# Patient Record
Sex: Male | Born: 1950 | Race: White | Hispanic: No | Marital: Married | Smoking: Never smoker
Health system: Southern US, Community
[De-identification: ages and names within clinical notes are randomized; demographics above are authoritative.]

## PROBLEM LIST (undated history)

## (undated) DIAGNOSIS — I10 Essential (primary) hypertension: Secondary | ICD-10-CM

---

## 2008-12-03 ENCOUNTER — Encounter: Payer: Self-pay | Admitting: Cardiology

## 2009-01-30 ENCOUNTER — Emergency Department (HOSPITAL_COMMUNITY): Admission: EM | Admit: 2009-01-30 | Discharge: 2009-01-30 | Payer: Self-pay | Admitting: Emergency Medicine

## 2009-02-11 ENCOUNTER — Emergency Department (HOSPITAL_COMMUNITY): Admission: EM | Admit: 2009-02-11 | Discharge: 2009-02-11 | Payer: Self-pay | Admitting: Emergency Medicine

## 2009-11-19 ENCOUNTER — Encounter: Payer: Self-pay | Admitting: Cardiology

## 2009-12-02 ENCOUNTER — Emergency Department (HOSPITAL_COMMUNITY): Admission: EM | Admit: 2009-12-02 | Discharge: 2009-12-02 | Payer: Self-pay | Admitting: Emergency Medicine

## 2010-04-18 IMAGING — CR DG CHEST 2V
2 series · 2 of 2 positions shown · non-contrast
Comparison: None

CLINICAL DATA: Cough, fever, congestion

CHEST - 2 VIEW

[view not recorded (1 of 2)]
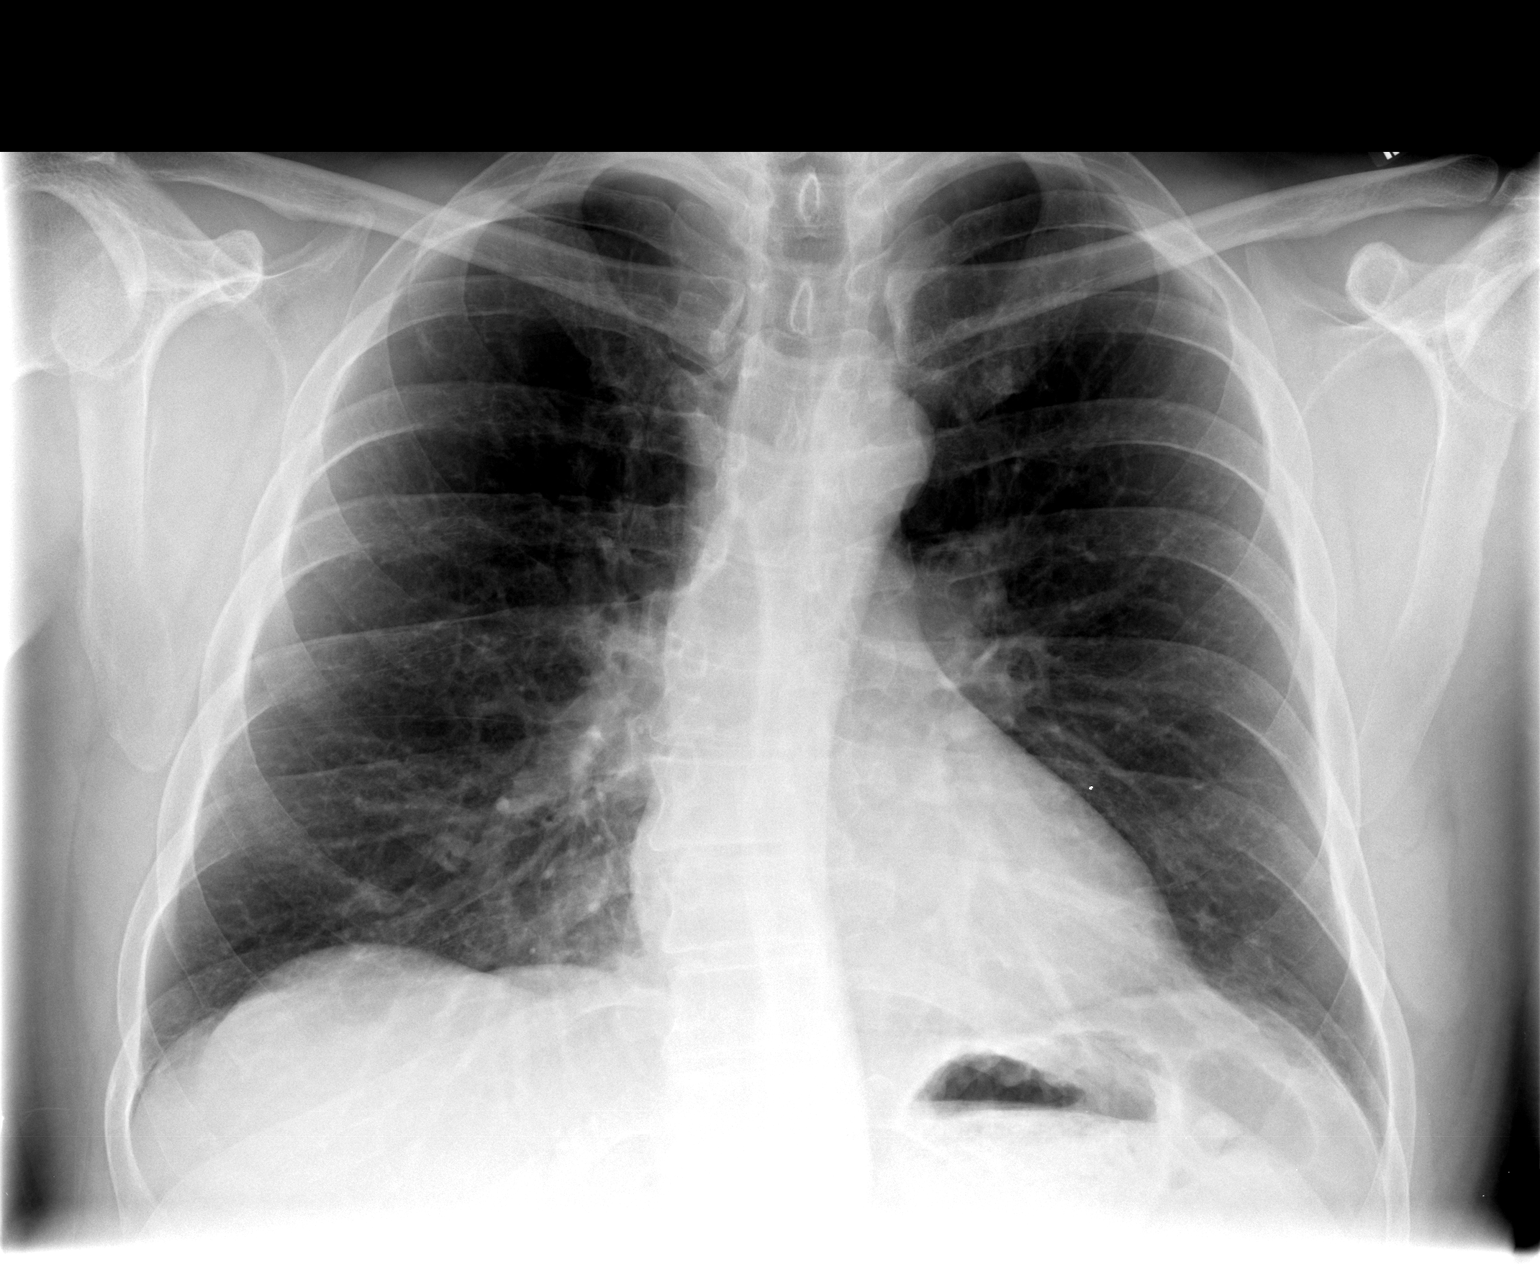

[view not recorded (2 of 2)]
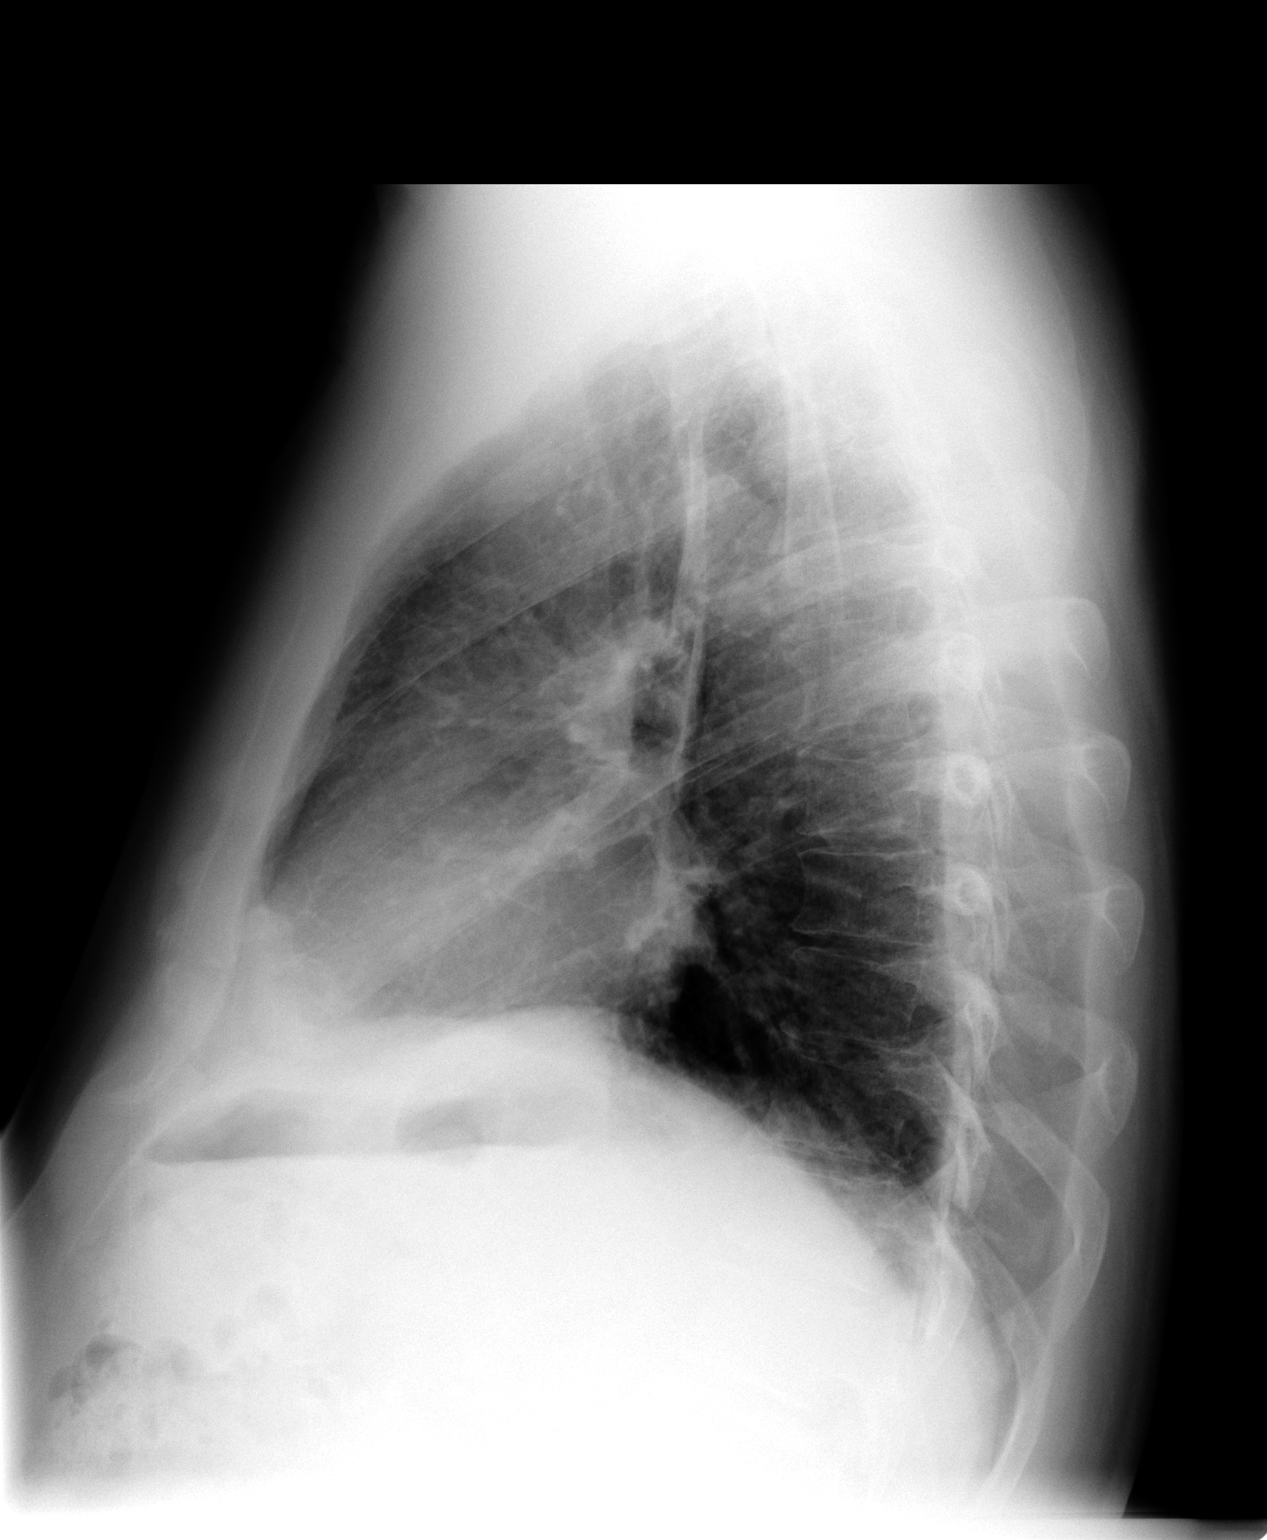

[2 of 2 positions shown; findings below may reference images not displayed]

FINDINGS: The lungs are clear.  There is some peribronchial
thickening which may indicate bronchitis.  The heart is within
normal limits in size.  There is a mild thoracic scoliosis present.
IMPRESSION: No active lung disease. Peribronchial thickening may indicate
bronchitis.

## 2010-09-22 NOTE — Letter (Signed)
Summary: OFFICE NOTE FROM [facility] OFFICE  OFFICE NOTE FROM [facility] OFFICE   Imported By: Claudette Laws 11/20/2009 09:32:01  _____________________________________________________________________  External Attachment:    Type:   Image     Comment:   External Document

## 2011-02-06 IMAGING — CR DG CHEST 2V
2 series · 2 of 2 positions shown · non-contrast
Comparison: 02/11/2009

CLINICAL DATA: Cough

CHEST - 2 VIEW

[view not recorded (1 of 2)]
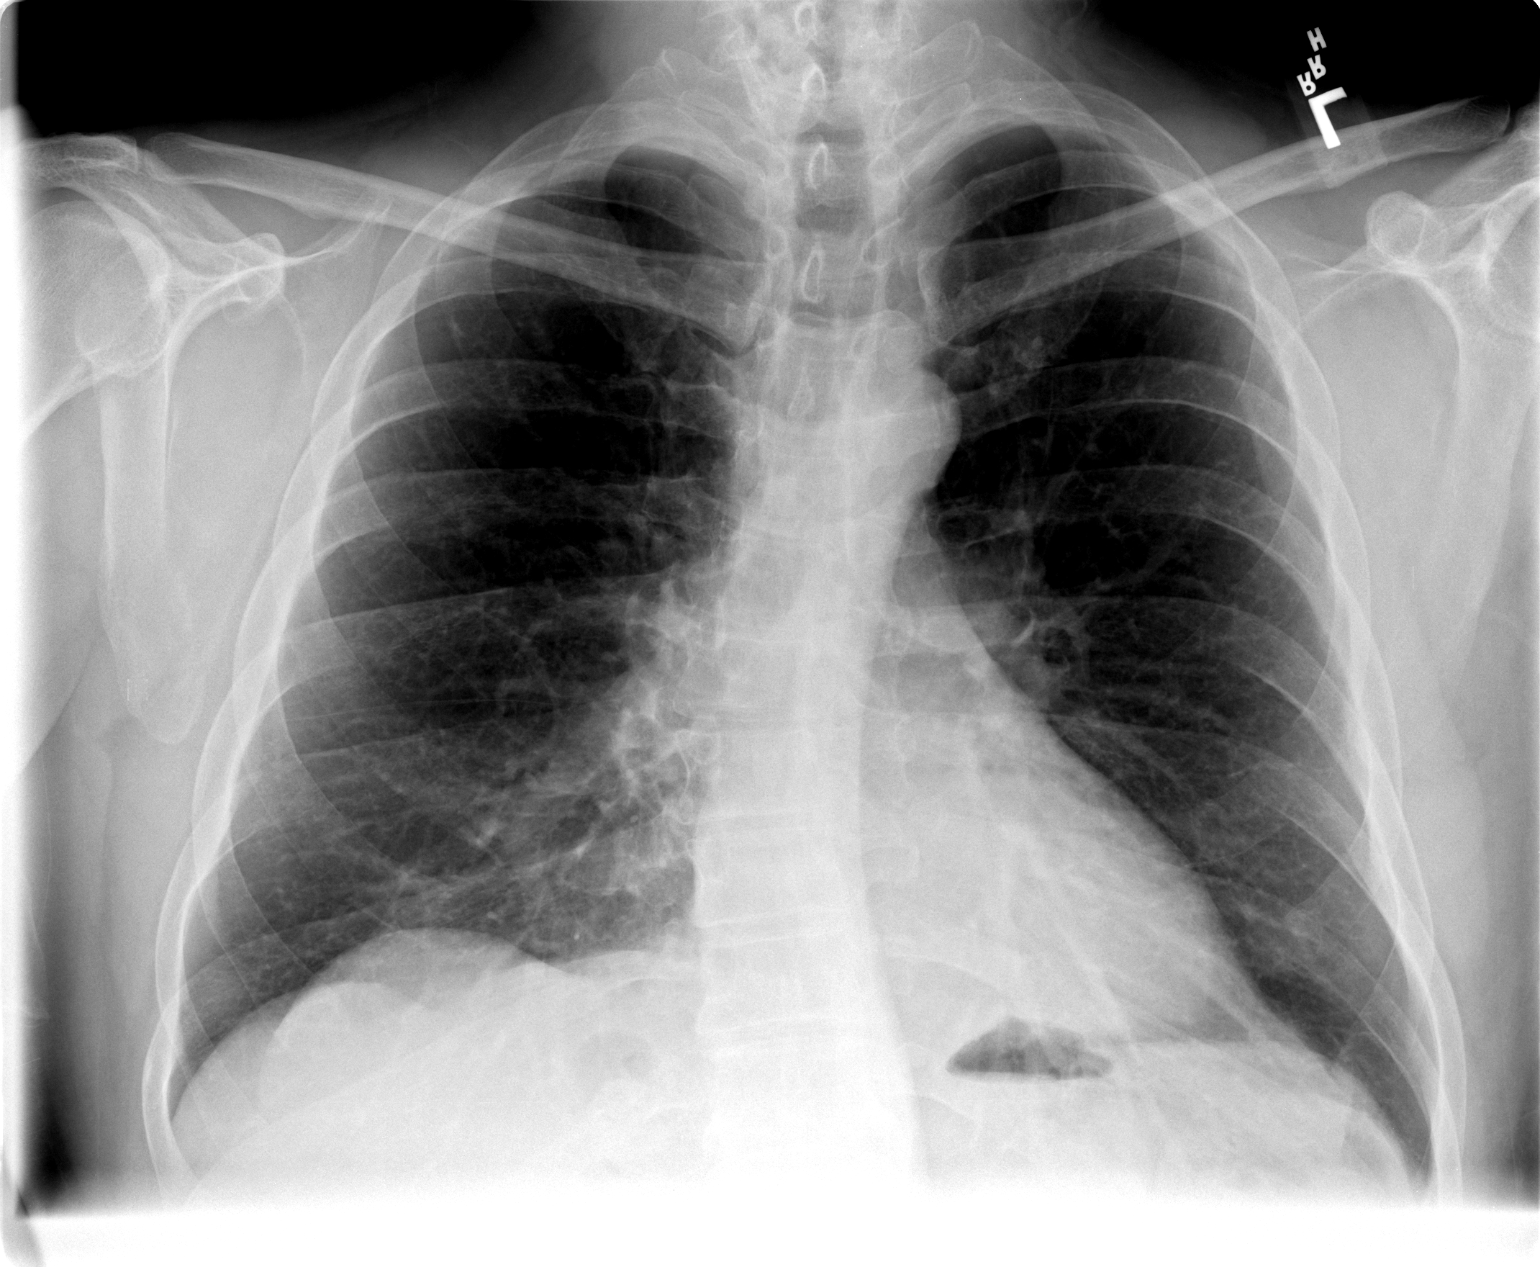

[view not recorded (2 of 2)]
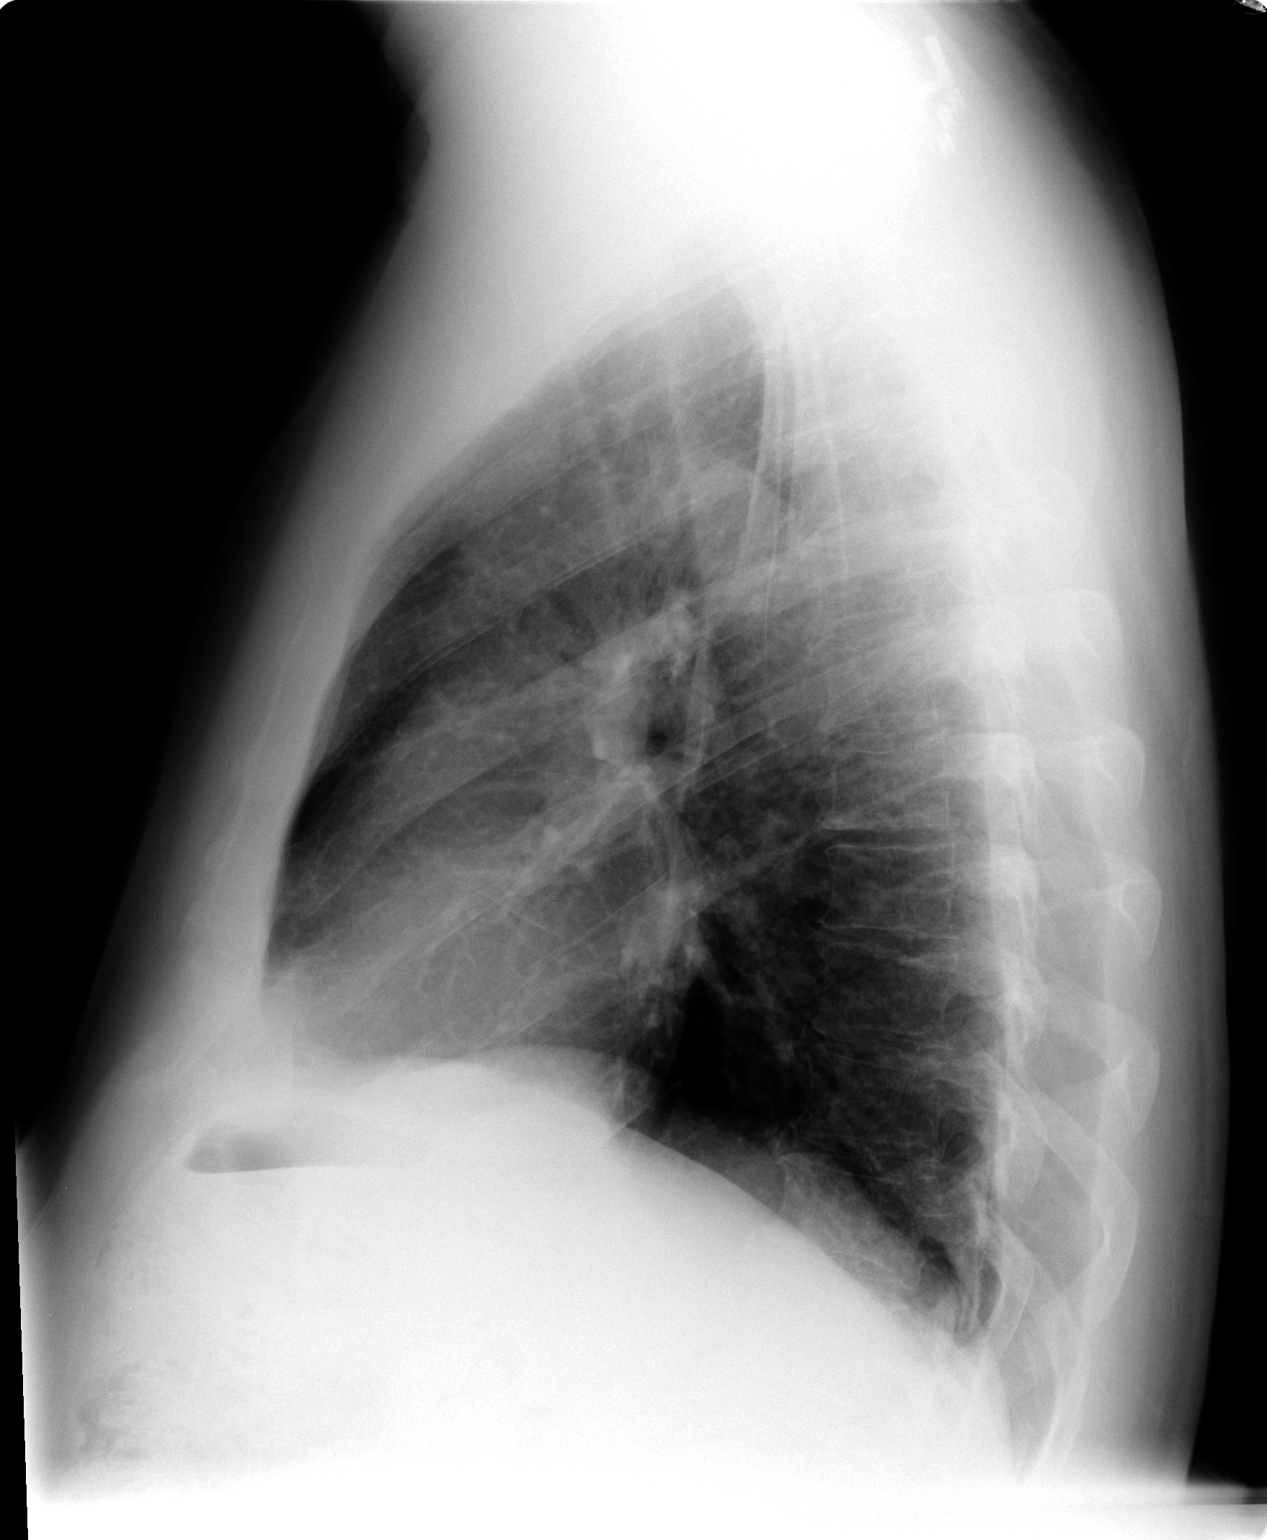

[2 of 2 positions shown; findings below may reference images not displayed]

FINDINGS: Mild dextroscoliosis of the thoracic spine is stable.
Lungs clear.  Heart size and pulmonary vascularity normal.  No
effusion.
IMPRESSION: No acute disease

## 2011-06-05 ENCOUNTER — Encounter: Payer: Self-pay | Admitting: *Deleted

## 2011-06-05 ENCOUNTER — Emergency Department (HOSPITAL_COMMUNITY)
Admission: EM | Admit: 2011-06-05 | Discharge: 2011-06-05 | Disposition: A | Discharge status: Home / Self Care | Payer: Medicaid - Out of State | Attending: Emergency Medicine | Admitting: Emergency Medicine

## 2011-06-05 DIAGNOSIS — L723 Sebaceous cyst: Secondary | ICD-10-CM | POA: Insufficient documentation

## 2011-06-05 DIAGNOSIS — L729 Follicular cyst of the skin and subcutaneous tissue, unspecified: Secondary | ICD-10-CM

## 2011-06-05 DIAGNOSIS — I10 Essential (primary) hypertension: Secondary | ICD-10-CM | POA: Insufficient documentation

## 2011-06-05 HISTORY — DX: Essential (primary) hypertension: I10

## 2011-06-05 MED ORDER — DOXYCYCLINE HYCLATE 100 MG PO TABS
100.0000 mg | ORAL_TABLET | Freq: Once | ORAL | Status: AC
  Administered 2011-06-05: 100 mg via ORAL
  Filled 2011-06-05: qty 1

## 2011-06-05 MED ORDER — DOXYCYCLINE HYCLATE 100 MG PO TABS: 100.0000 mg | ORAL_TABLET | Freq: Two times a day (BID) | ORAL | Status: AC

## 2011-06-05 NOTE — ED Notes (Signed)
Pt a/ox4. Resp even and unlabored. NAD at this time. D/C instructions and Rx reviewed with pt. Pt verbalized understanding. Pt ambulated to POV with steady gate.  

## 2011-06-05 NOTE — ED Notes (Signed)
Pt has red, hard, swollen area to his left upper arm x 2 days. States that he had blood drawn in his left arm approx. 1 1/2 weeks ago.

## 2011-06-05 NOTE — ED Provider Notes (Signed)
History     CSN: [id number] Arrival date & time: 06/05/2011  6:06 PM  Chief Complaint  Patient presents with  . Abscess    (Consider location/radiation/quality/duration/timing/severity/associated sxs/prior treatment) Patient is a 60 y.o. male presenting with abscess. The history is provided by the patient.  Abscess  This is a new problem. The current episode started more than one week ago. The problem has been gradually worsening. The abscess is present on the left arm. The problem is mild. The abscess is characterized by redness and swelling. It is unknown (He does report having blood drawn from this arm (antecubital) 2 weeks ago.   The  lesion is about 4 inches above this area, however.) what he was exposed to. Pertinent negatives include no fever, no congestion, no sore throat and no cough. There were no sick contacts.    Past Medical History  Diagnosis Date  . Hypertension     History reviewed. No pertinent past surgical history.  History reviewed. No pertinent family history.  History  Substance Use Topics  . Smoking status: Never Smoker   . Smokeless tobacco: Not on file  . Alcohol Use: No      Review of Systems  Constitutional: Negative for fever.  HENT: Negative for congestion, sore throat and neck pain.   Eyes: Negative.   Respiratory: Negative for cough, chest tightness and shortness of breath.   Cardiovascular: Negative for chest pain.  Gastrointestinal: Negative for nausea and abdominal pain.  Genitourinary: Negative.   Musculoskeletal: Negative for joint swelling and arthralgias.  Skin: Positive for color change. Negative for rash and wound.  Neurological: Negative for dizziness, weakness, light-headedness, numbness and headaches.  Hematological: Negative.   Psychiatric/Behavioral: Negative.     Allergies  Keflex; Other; and Penicillins  Home Medications   Current Outpatient Rx  Name Route Sig Dispense Refill  . CETIRIZINE HCL 10 MG PO TABS Oral  Take 10 mg by mouth daily.      Marland Kitchen CIPROFLOXACIN HCL 250 MG PO TABS Oral Take 250 mg by mouth 2 (two) times daily. Patient taking for 7 days 06/03/2011       . LISINOPRIL-HYDROCHLOROTHIAZIDE 20-12.5 MG PO TABS Oral Take 1 tablet by mouth daily.      Marland Kitchen DOXYCYCLINE HYCLATE 100 MG PO TABS Oral Take 1 tablet (100 mg total) by mouth 2 (two) times daily. 20 tablet 0    BP 132/74  Pulse 92  Resp 20  Ht 5\' 11"  (1.803 m)  Wt 212 lb (96.163 kg)  BMI 29.57 kg/m2  SpO2 98%  Physical Exam  Nursing note and vitals reviewed. Constitutional: He is oriented to person, place, and time. He appears well-developed and well-nourished.  HENT:  Head: Normocephalic and atraumatic.  Eyes: Conjunctivae are normal.  Neck: Normal range of motion.  Cardiovascular: Normal rate, regular rhythm, normal heart sounds and intact distal pulses.   Pulmonary/Chest: Effort normal and breath sounds normal. He has no wheezes.  Abdominal: Soft. Bowel sounds are normal. There is no tenderness.  Musculoskeletal: Normal range of motion.  Neurological: He is alert and oriented to person, place, and time.  Skin: Skin is warm and dry. There is erythema.       Raised,  2 cm,  Round,  Mobile,  Cystlike lesion left medial mid upper arm, with erythema. Very circumscribed and distinct.   No fluctuance.  Minimal ttp.  No red streaking,  No axillary lymphadenopathy.  Psychiatric: He has a normal mood and affect.    ED Course  Procedures (including critical care time)  Labs Reviewed - No data to display No results found.   1. Infected cyst of skin       MDM  Doxycycline.  Warm compresses.  F/U pcp in several days for recheck.        Candis Musa, PA 06/05/11 1953

## 2011-06-08 NOTE — ED Provider Notes (Signed)
Medical screening examination/treatment/procedure(s) were performed by non-physician practitioner and as supervising physician I was immediately available for consultation/collaboration.   Gerhard Munch, MD 06/08/11 1524

## 2014-01-25 ENCOUNTER — Other Ambulatory Visit (HOSPITAL_COMMUNITY): Payer: Self-pay | Admitting: Family

## 2014-01-25 DIAGNOSIS — E041 Nontoxic single thyroid nodule: Secondary | ICD-10-CM

## 2014-01-31 ENCOUNTER — Ambulatory Visit (HOSPITAL_COMMUNITY): Payer: Medicaid - Out of State

## 2014-02-12 ENCOUNTER — Other Ambulatory Visit (HOSPITAL_COMMUNITY): Payer: Medicaid - Out of State

## 2014-02-14 ENCOUNTER — Other Ambulatory Visit (HOSPITAL_COMMUNITY): Payer: Self-pay | Admitting: Family

## 2014-02-14 ENCOUNTER — Ambulatory Visit (HOSPITAL_COMMUNITY)
Admission: RE | Admit: 2014-02-14 | Discharge: 2014-02-14 | Disposition: A | Discharge status: Home / Self Care | Payer: Medicaid - Out of State | Source: Ambulatory Visit | Attending: Family | Admitting: Family

## 2014-02-14 DIAGNOSIS — E041 Nontoxic single thyroid nodule: Secondary | ICD-10-CM

## 2014-02-20 ENCOUNTER — Other Ambulatory Visit (HOSPITAL_COMMUNITY): Payer: Self-pay | Admitting: Family

## 2014-02-20 DIAGNOSIS — E041 Nontoxic single thyroid nodule: Secondary | ICD-10-CM

## 2014-02-20 DIAGNOSIS — K118 Other diseases of salivary glands: Secondary | ICD-10-CM

## 2014-03-11 ENCOUNTER — Ambulatory Visit (HOSPITAL_COMMUNITY): Payer: PRIVATE HEALTH INSURANCE

## 2014-03-27 ENCOUNTER — Emergency Department (HOSPITAL_COMMUNITY)
Admission: EM | Admit: 2014-03-27 | Discharge: 2014-03-27 | Disposition: A | Discharge status: Home / Self Care | Payer: PRIVATE HEALTH INSURANCE | Attending: Emergency Medicine | Admitting: Emergency Medicine

## 2014-03-27 ENCOUNTER — Encounter (HOSPITAL_COMMUNITY): Payer: Self-pay | Admitting: Emergency Medicine

## 2014-03-27 DIAGNOSIS — Z792 Long term (current) use of antibiotics: Secondary | ICD-10-CM | POA: Insufficient documentation

## 2014-03-27 DIAGNOSIS — I10 Essential (primary) hypertension: Secondary | ICD-10-CM | POA: Insufficient documentation

## 2014-03-27 DIAGNOSIS — M79609 Pain in unspecified limb: Secondary | ICD-10-CM | POA: Insufficient documentation

## 2014-03-27 DIAGNOSIS — Z79899 Other long term (current) drug therapy: Secondary | ICD-10-CM | POA: Insufficient documentation

## 2014-03-27 DIAGNOSIS — Z880 Allergy status to penicillin: Secondary | ICD-10-CM | POA: Insufficient documentation

## 2014-03-27 DIAGNOSIS — M79602 Pain in left arm: Secondary | ICD-10-CM

## 2014-03-27 MED ORDER — IBUPROFEN 600 MG PO TABS: 600.0000 mg | ORAL_TABLET | Freq: Three times a day (TID) | ORAL | Status: AC | PRN

## 2014-03-27 MED ORDER — IBUPROFEN 400 MG PO TABS
600.0000 mg | ORAL_TABLET | Freq: Once | ORAL | Status: AC
  Administered 2014-03-27: 600 mg via ORAL
  Filled 2014-03-27: qty 2

## 2014-03-27 NOTE — ED Provider Notes (Signed)
CSN: [id number]     Arrival date & time 03/27/14  0158 History   First MD Initiated Contact with Patient 03/27/14 0221     Chief Complaint  Patient presents with  . Arm Pain      HPI Patient reports his had constant left arm pain over the past 24 hours.  He states it was throbbing and when he awoke in the middle the night he figured he should have it looked at.  He lives at home by himself.  He denies chest pain shortness of breath.  No prior history of cardiac disease.  No exertional chest pain or shortness of breath recently.  His pain in his left arm is worse with movement of his left arm and palpation of his left proximal forearm.  He states he is taking a class at the local community [employer] and has been riding more and sometimes feels as though he does writer's cramp.  He states his arm throbs in similar fashion to this.  He denies weakness.  Denies numbness or tingling in his hands.  The throbbing pain is only focused around the proximal forearm.  No recent injury or trauma   Past Medical History  Diagnosis Date  . Hypertension    History reviewed. No pertinent past surgical history. History reviewed. No pertinent family history. History  Substance Use Topics  . Smoking status: Never Smoker   . Smokeless tobacco: Not on file  . Alcohol Use: No    Review of Systems  All other systems reviewed and are negative.     Allergies  Cephalexin; Other; and Penicillins  Home Medications   Prior to Admission medications   Medication Sig Start Date End Date Taking? Authorizing Provider  cetirizine (ZYRTEC) 10 MG tablet Take 10 mg by mouth daily.     Yes Historical Provider, MD  lisinopril-hydrochlorothiazide (PRINZIDE,ZESTORETIC) 20-12.5 MG per tablet Take 1 tablet by mouth daily.     Yes Historical Provider, MD  tamsulosin (FLOMAX) 0.4 MG CAPS capsule Take 0.4 mg by mouth daily.   Yes Historical Provider, MD  ciprofloxacin (CIPRO) 250 MG tablet Take 250 mg by mouth 2 (two) times  daily. Patient taking for 7 days 06/03/2011       Historical Provider, MD          BP 127/78  Pulse 94  Temp(Src) 97.9 F (36.6 C) (Oral)  Resp 20  Ht 5\' 9"  (1.753 m)  Wt 222 lb (100.699 kg)  BMI 32.77 kg/m2  SpO2 100% Physical Exam  Nursing note and vitals reviewed. Constitutional: He is oriented to person, place, and time. He appears well-developed and well-nourished.  HENT:  Head: Normocephalic and atraumatic.  Eyes: EOM are normal.  Neck: Normal range of motion.  Cardiovascular: Normal rate, regular rhythm, normal heart sounds and intact distal pulses.   Pulmonary/Chest: Effort normal and breath sounds normal. No respiratory distress.  Abdominal: Soft. He exhibits no distension. There is no tenderness.  Musculoskeletal: Normal range of motion.  Mild tenderness of the left posterior proximal forearm.  No overlying skin changes.  Full range of motion of left wrist and left elbow.  No obvious left elbow effusion noted.  Normal left radial pulse present.  Normal grip strength in left hand.  Full range of motion of left shoulder.  Neurological: He is alert and oriented to person, place, and time.  Skin: Skin is warm and dry.  Psychiatric: He has a normal mood and affect. Judgment normal.    ED Course  Procedures (including critical care time) Labs Review Labs Reviewed - No data to display  Imaging Review No results found.   EKG Interpretation   Date/Time:  Wednesday March 27 2014 02:31:22 EDT Ventricular Rate:  78 PR Interval:  169 QRS Duration: 86 QT Interval:  370 QTC Calculation: 421 R Axis:   -41 Text Interpretation:  Sinus rhythm Left axis deviation Abnormal R-wave  progression, late transition No old tracing to compare Confirmed by Ranbir Chew   MD, Esma Kilts ([id number]) on 03/27/2014 2:41:30 AM      MDM   Final diagnoses:  Left arm pain    Likely musculoskeletal arm pain.  Doubt ACS.  EKG normal.  No other associated symptoms.  No signs of infection.  Normal radial  pulse suggests no ischemia    Lyanne Co, MD 03/27/14 [phone]

## 2014-03-27 NOTE — ED Notes (Signed)
Pt has c/o of left arm pain.

## 2014-04-02 ENCOUNTER — Encounter (HOSPITAL_COMMUNITY): Payer: Self-pay | Admitting: Emergency Medicine

## 2014-04-02 ENCOUNTER — Emergency Department (HOSPITAL_COMMUNITY)
Admission: EM | Admit: 2014-04-02 | Discharge: 2014-04-02 | Discharge status: Against Medical Advice | Payer: Medicaid - Out of State | Attending: Emergency Medicine | Admitting: Emergency Medicine

## 2014-04-02 DIAGNOSIS — R109 Unspecified abdominal pain: Secondary | ICD-10-CM | POA: Diagnosis present

## 2014-04-02 NOTE — ED Notes (Signed)
Pt c/o rt upper abd pain that is relieved by motrin, but decided not to take any tonight and came here to be seen.

## 2014-04-02 NOTE — ED Notes (Signed)
Pt reporting pain in upper abdomen.  States that he has been doing a lot of physical therapy for his back, and believes abdominal pain is muscle soreness due to that.
